# Patient Record
Sex: Male | Born: 2005 | Hispanic: Yes | Marital: Single | Smoking: Never smoker
Health system: Southern US, Community
[De-identification: ages and names within clinical notes are randomized; demographics above are authoritative.]

---
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
## 2023-06-25 IMAGING — DX DG FOOT COMPLETE 3+V*R*
3 series · 3 of 3 positions shown · non-contrast
Comparison: None.

CLINICAL DATA: Great toe swelling

EXAM:
RIGHT FOOT COMPLETE - 3 VIEW

[foot ap]
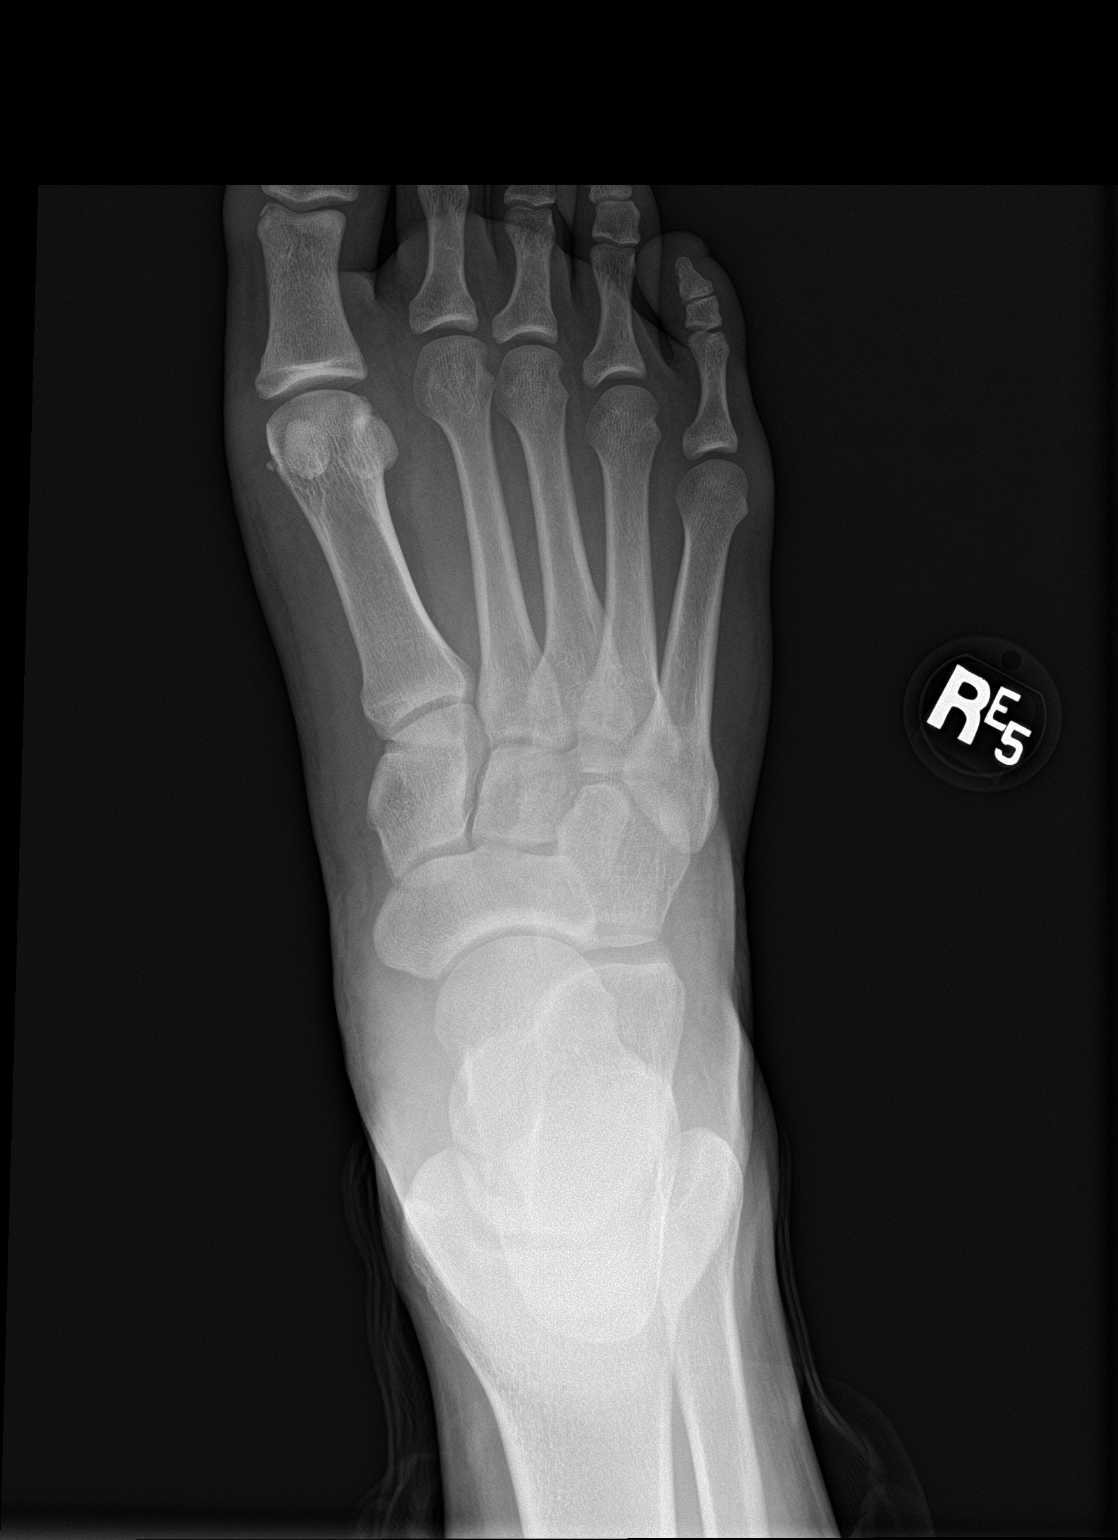

[foot obl]
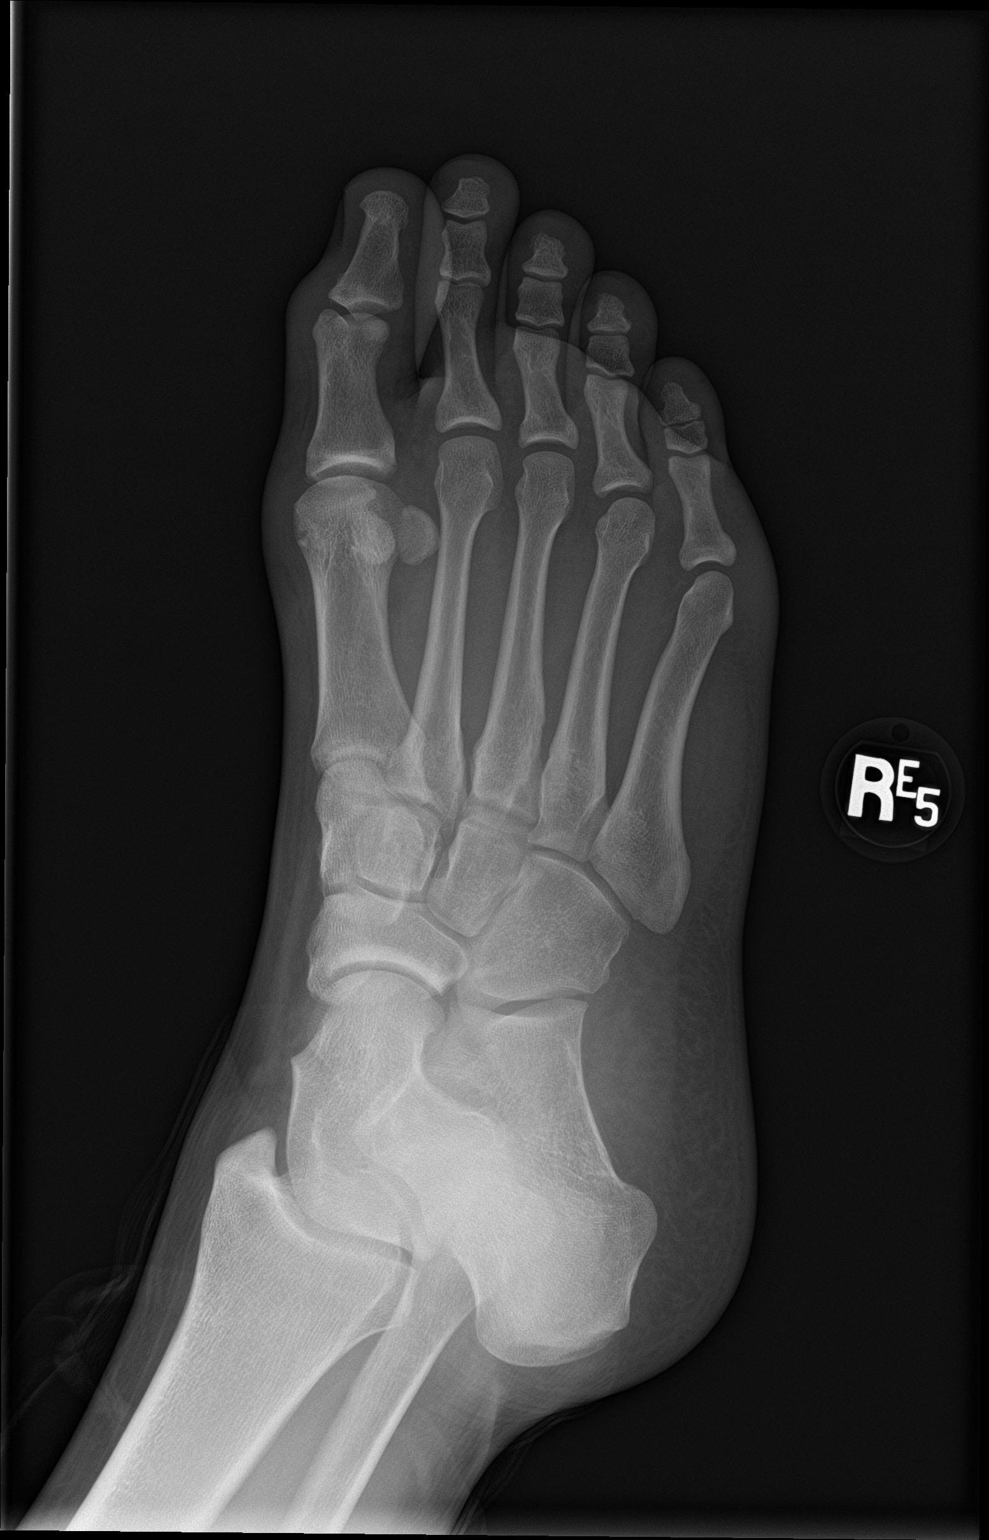

[foot lat]
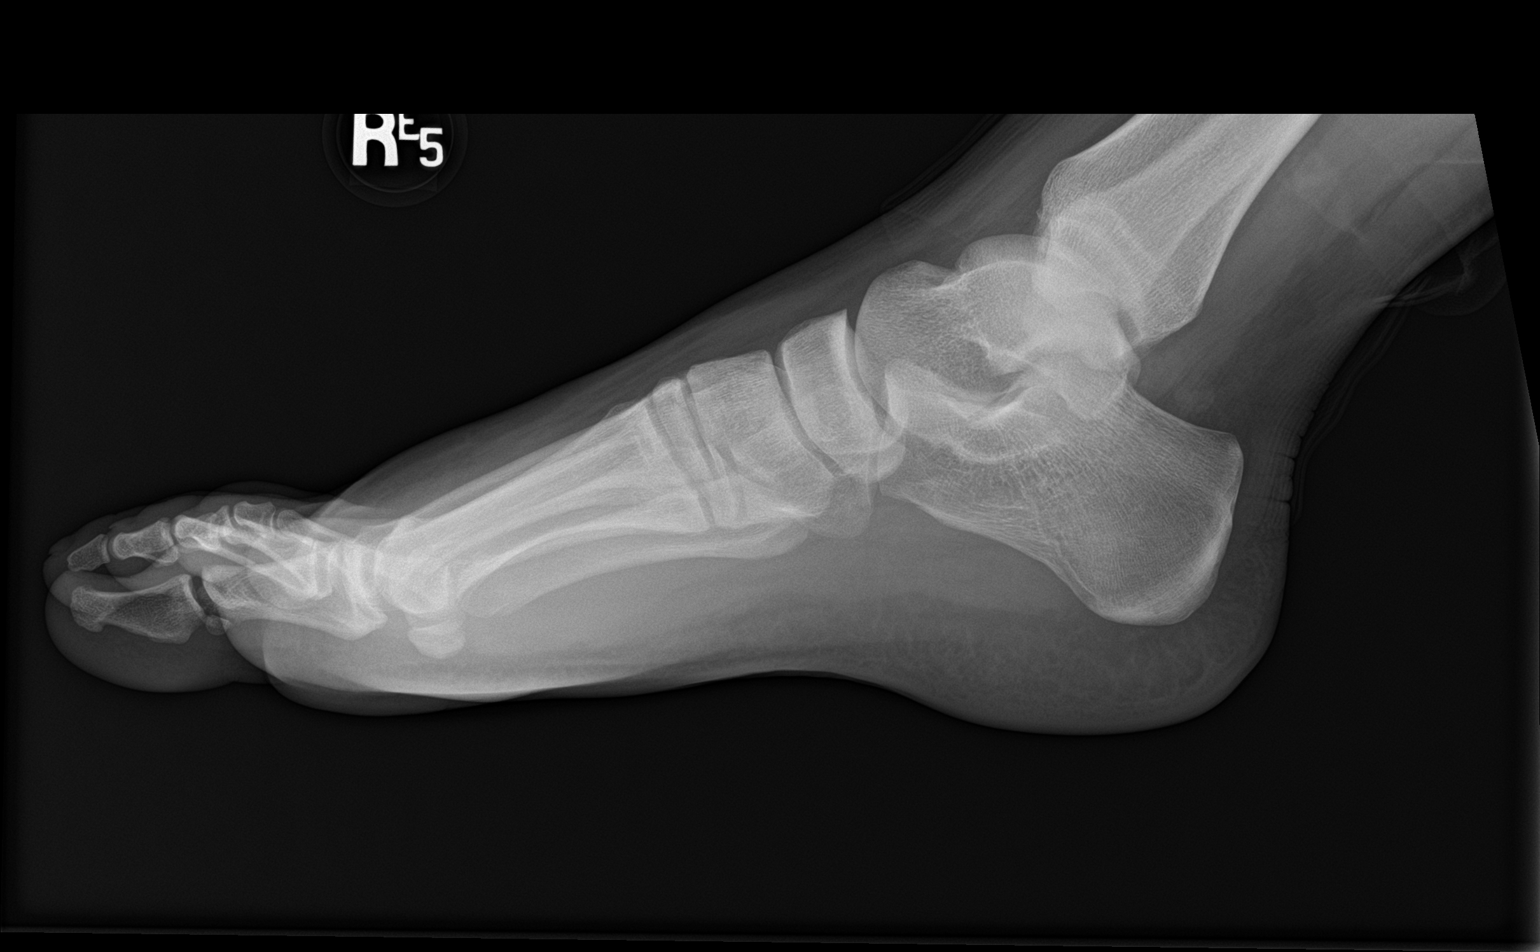

[3 of 3 positions shown; findings below may reference images not displayed]

FINDINGS: Subtle nondisplaced fracture involving the distal most aspect of the
proximal phalanx of the great toe, may minimally extend to the
medial articular surface.

No evidence of dislocation. There is no evidence of arthropathy or
other focal bone abnormality. Soft tissue swelling of the great toe.
IMPRESSION: Subtle nondisplaced fracture involving the distal most aspect of the
proximal phalanx of the great toe, may minimally extend to the
medial articular surface.
